# Patient Record
Sex: Female | Born: 1950 | Race: White | Hispanic: No | Smoking: Never smoker
Health system: Southern US, Community
[De-identification: ages and names within clinical notes are randomized; demographics above are authoritative.]

## PROBLEM LIST (undated history)

## (undated) DIAGNOSIS — E039 Hypothyroidism, unspecified: Secondary | ICD-10-CM

## (undated) DIAGNOSIS — F419 Anxiety disorder, unspecified: Secondary | ICD-10-CM

## (undated) DIAGNOSIS — M1990 Unspecified osteoarthritis, unspecified site: Secondary | ICD-10-CM

## (undated) HISTORY — DX: Hypothyroidism, unspecified: E03.9

## (undated) HISTORY — DX: Unspecified osteoarthritis, unspecified site: M19.90

## (undated) HISTORY — PX: ENDOMETRIAL ABLATION: SHX621

## (undated) HISTORY — DX: Anxiety disorder, unspecified: F41.9

---
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
## 2004-06-22 ENCOUNTER — Ambulatory Visit: Payer: Self-pay | Admitting: Internal Medicine

## 2012-12-03 ENCOUNTER — Ambulatory Visit: Payer: Self-pay | Admitting: Medical

## 2013-05-25 DIAGNOSIS — C44320 Squamous cell carcinoma of skin of unspecified parts of face: Secondary | ICD-10-CM | POA: Insufficient documentation

## 2014-11-09 ENCOUNTER — Encounter: Payer: Self-pay | Admitting: Gastroenterology

## 2014-12-28 ENCOUNTER — Ambulatory Visit (AMBULATORY_SURGERY_CENTER): Payer: Self-pay

## 2014-12-28 VITALS — Ht 64.0 in | Wt 105.4 lb

## 2014-12-28 DIAGNOSIS — Z1211 Encounter for screening for malignant neoplasm of colon: Secondary | ICD-10-CM

## 2014-12-28 NOTE — Progress Notes (Signed)
No allergies to eggs or soy No diet/weight loss meds No past problems with anesthesia except PONV with general anesthesia No home oxygen  Refused emmi

## 2015-01-11 ENCOUNTER — Encounter: Payer: Self-pay | Admitting: Gastroenterology

## 2015-01-11 ENCOUNTER — Ambulatory Visit (AMBULATORY_SURGERY_CENTER): Payer: BLUE CROSS/BLUE SHIELD | Admitting: Gastroenterology

## 2015-01-11 VITALS — BP 129/94 | HR 71 | Temp 98.2°F | Resp 22 | Ht 64.0 in | Wt 105.0 lb

## 2015-01-11 DIAGNOSIS — Z1211 Encounter for screening for malignant neoplasm of colon: Secondary | ICD-10-CM

## 2015-01-11 MED ORDER — SODIUM CHLORIDE 0.9 % IV SOLN: 500.0000 mL | INTRAVENOUS | Status: DC

## 2015-01-11 NOTE — Op Note (Signed)
[place]  [address]. [place], [address]   COLONOSCOPY PROCEDURE REPORT  PATIENT: Samantha, Santana  MR#: [id number] BIRTHDATE: 12-14-1950 , 23  yrs. old GENDER: female ENDOSCOPIST: Ladene Artist, MD, [facility] REFERRED TX:MIWOE Noah Delaine, M.D. PROCEDURE DATE:  01/11/2015 PROCEDURE:   Colonoscopy, screening First Screening Colonoscopy - Avg.  risk and is 50 yrs.  old or older - No.  Prior Negative Screening - Now for repeat screening. 10 or more years since last screening  History of Adenoma - Now for follow-up colonoscopy & has been > or = to 3 yrs.  N/A  Polyps removed today? No Recommend repeat exam, <10 yrs? No ASA CLASS:   Class II INDICATIONS:Screening for colonic neoplasia and Colorectal Neoplasm Risk Assessment for this procedure is average risk. MEDICATIONS: Monitored anesthesia care and Propofol 200 mg IV DESCRIPTION OF PROCEDURE:   After the risks benefits and alternatives of the procedure were thoroughly explained, informed consent was obtained.  The digital rectal exam revealed no abnormalities of the rectum.   The LB PFC-H190 [id number]  endoscope was introduced through the anus and advanced to the cecum, which was identified by both the appendix and ileocecal valve. No adverse events experienced with a tortuous colon.   The quality of the prep was good.  (Suprep was used)  The instrument was then slowly withdrawn as the colon was fully examined. Estimated blood loss is zero unless otherwise noted in this procedure report.    COLON FINDINGS: A normal appearing cecum, ileocecal valve, and appendiceal orifice were identified.  The ascending, transverse, descending, sigmoid colon, and rectum appeared unremarkable. Retroflexed views revealed no abnormalities. The time to cecum = 4.9 Withdrawal time = 10.2   The scope was withdrawn and the procedure completed. COMPLICATIONS: There were no immediate complications.  ENDOSCOPIC IMPRESSION: Normal  colonoscopy  RECOMMENDATIONS: Continue current colorectal screening recommendations for "routine risk" patients with a repeat colonoscopy in 10 years.  eSigned:  Ladene Artist, MD, [facility] 01/11/2015 3:12 PM

## 2015-01-11 NOTE — Progress Notes (Signed)
Report to PACU, RN, vss, BBS= Clear.  

## 2015-01-11 NOTE — Patient Instructions (Signed)

## 2015-01-12 ENCOUNTER — Telehealth: Payer: Self-pay

## 2015-01-12 NOTE — Telephone Encounter (Signed)
  Follow up Call-  Call back number 01/11/2015  Post procedure Call Back phone  # [phone]  Permission to leave phone message Yes     Patient questions:  Do you have a fever, pain , or abdominal swelling? No. Pain Score  0 *  Have you tolerated food without any problems? Yes.    Have you been able to return to your normal activities? Yes.    Do you have any questions about your discharge instructions: Diet   No. Medications  No. Follow up visit  No.  Do you have questions or concerns about your Care? No.  Actions: * If pain score is 4 or above: No action needed, pain <4.  No problems per the pt. maw

## 2015-03-02 ENCOUNTER — Ambulatory Visit
Admission: RE | Admit: 2015-03-02 | Discharge: 2015-03-02 | Disposition: A | Discharge status: Home / Self Care | Payer: BLUE CROSS/BLUE SHIELD | Source: Ambulatory Visit | Attending: Medical | Admitting: Medical

## 2015-03-02 ENCOUNTER — Other Ambulatory Visit: Payer: Self-pay | Admitting: Medical

## 2015-03-02 DIAGNOSIS — R053 Chronic cough: Secondary | ICD-10-CM

## 2015-03-02 DIAGNOSIS — R05 Cough: Secondary | ICD-10-CM | POA: Diagnosis not present

## 2015-03-22 ENCOUNTER — Other Ambulatory Visit (INDEPENDENT_AMBULATORY_CARE_PROVIDER_SITE_OTHER): Payer: BLUE CROSS/BLUE SHIELD

## 2015-03-22 ENCOUNTER — Ambulatory Visit (INDEPENDENT_AMBULATORY_CARE_PROVIDER_SITE_OTHER): Payer: BLUE CROSS/BLUE SHIELD | Admitting: Internal Medicine

## 2015-03-22 ENCOUNTER — Encounter: Payer: Self-pay | Admitting: Internal Medicine

## 2015-03-22 VITALS — BP 172/90 | HR 90 | Ht 64.0 in | Wt 104.6 lb

## 2015-03-22 DIAGNOSIS — J45991 Cough variant asthma: Secondary | ICD-10-CM | POA: Diagnosis not present

## 2015-03-22 DIAGNOSIS — I10 Essential (primary) hypertension: Secondary | ICD-10-CM

## 2015-03-22 LAB — CBC WITH DIFFERENTIAL/PLATELET
Basophils Absolute: 0 10*3/uL (ref 0.0–0.1)
Basophils Relative: 0.5 % (ref 0.0–3.0)
EOS PCT: 2.5 % (ref 0.0–5.0)
Eosinophils Absolute: 0.2 10*3/uL (ref 0.0–0.7)
HCT: 42.4 % (ref 36.0–46.0)
Hemoglobin: 13.9 g/dL (ref 12.0–15.0)
LYMPHS ABS: 2.6 10*3/uL (ref 0.7–4.0)
Lymphocytes Relative: 31.6 % (ref 12.0–46.0)
MCHC: 32.7 g/dL (ref 30.0–36.0)
MCV: 84 fl (ref 78.0–100.0)
MONOS PCT: 6.5 % (ref 3.0–12.0)
Monocytes Absolute: 0.5 10*3/uL (ref 0.1–1.0)
NEUTROS ABS: 4.8 10*3/uL (ref 1.4–7.7)
NEUTROS PCT: 58.9 % (ref 43.0–77.0)
PLATELETS: 417 10*3/uL — AB (ref 150.0–400.0)
RBC: 5.05 Mil/uL (ref 3.87–5.11)
RDW: 14.7 % (ref 11.5–15.5)
WBC: 8.1 10*3/uL (ref 4.0–10.5)

## 2015-03-22 LAB — NITRIC OXIDE: Nitric Oxide: 11

## 2015-03-22 MED ORDER — MONTELUKAST SODIUM 10 MG PO TABS: ORAL_TABLET | ORAL | Status: DC

## 2015-03-22 NOTE — Progress Notes (Signed)
Subjective:    Patient ID: Samantha Santana, female    DOB: 17-Mar-1951,     MRN: [id number]  HPI  88 yowf never smoker with onset of itching sneezing running nose x 2010 better p zyrtec then trip to [place] 2012 with "double  pna" on return but "never right since" with worse rhinitis symptoms  More year round pattern with throat tickling and wheeze with rx for advair x 2014-15 but only uses prn not sure it works so referred 03/22/2015 by Liz Malady to pulmonary clinic with ? Cough variant asthma ?   03/22/2015 1st [facility] office visit/ Alissia Lory   Chief Complaint  Patient presents with  . PULMONARY CONSULT    Pt referred by Dr. Estevan Oaks for mild asthma: pt states shes never been diagnosed with asthma. pt states shes spent the fall with dry cough, sneezing starting with a tickle in the back left of her throat and eye watering. pt states she had advair shes had previously and that seemed to help. no c/o SOB, wheezing, or chest tightness.  worse symptoms since  the fall 2016 cough is dry, variably severe, random random during day and also waking at noct assoc with sense of pnds  flonase may have helped when takes it / advair may have helped initially but did have really bad coughing while on it so stopped it completely   No obvious other patterns in day to day or daytime variabilty or assoc sob  or cp or chest tightness, subjective wheeze overt sinus or hb symptoms. No unusual exp hx or h/o childhood pna/ asthma or knowledge of premature birth.  Sleeping ok without nocturnal  or early am exacerbation  of respiratory  c/o's or need for noct saba. Also denies any obvious fluctuation of symptoms with weather or environmental changes or other aggravating or alleviating factors except as outlined above   Current Medications, Allergies, Complete Past Medical History, Past Surgical History, Family History, and Social History were reviewed in [employer] record.         Review of Systems  Constitutional: Negative for fever and unexpected weight change.  HENT: Positive for postnasal drip and sneezing. Negative for congestion, dental problem, ear pain, nosebleeds, rhinorrhea, sinus pressure, sore throat and trouble swallowing.   Eyes: Negative for redness and itching.  Respiratory: Positive for cough. Negative for chest tightness, shortness of breath and wheezing.   Cardiovascular: Negative for palpitations and leg swelling.  Gastrointestinal: Negative for nausea and vomiting.  Genitourinary: Negative for dysuria.  Musculoskeletal: Positive for joint swelling.  Skin: Negative for rash.  Neurological: Positive for headaches.  Hematological: Does not bruise/bleed easily.  Psychiatric/Behavioral: Negative for dysphoric mood. The patient is not nervous/anxious.        Objective:   Physical Exam  Very animated pleasant amb wf nad  Wt Readings from Last 3 Encounters:  03/22/15 104 lb 9.6 oz (47.446 kg)  01/11/15 105 lb (47.628 kg)  12/28/14 105 lb 6.4 oz (47.809 kg)    Vital signs reviewed    HEENT: nl dentition, turbinates, and oropharynx. Nl external ear canals without cough reflex   NECK :  without JVD/Nodes/TM/ nl carotid upstrokes bilaterally   LUNGS: no acc muscle use,  Nl contour chest which is clear to A and P bilaterally without cough on insp or exp maneuvers   CV:  RRR  no s3 or murmur or increase in P2, no edema   ABD:  soft and nontender with nl inspiratory  excursion in the supine position. No bruits or organomegaly, bowel sounds nl  MS:  Nl gait/ ext warm without deformities, calf tenderness, cyanosis or clubbing No obvious joint restrictions   SKIN: warm and dry without lesions    NEURO:  alert, approp, nl sensorium with  no motor deficits     I personally reviewed images and agree with radiology impression as follows:  CXR:  03/02/15 1. There is no evidence of pneumonia, CHF, nor other acute cardiopulmonary  abnormality. 2. Mild hyperinflation may be voluntary or may reflect underlying reactive airway disease. 3. There is evidence of previous granulomatous infection   Labs ordered 03/22/2015  Cbc with diff,  Allergy profile      Assessment & Plan:

## 2015-03-22 NOTE — Patient Instructions (Addendum)
For drainage / throat tickle try take CHLORPHENIRAMINE  4 mg - take one every 4 hours as needed - available over the counter- may cause drowsiness so start with just a bedtime dose or two and see how you tolerate it before trying in daytime    GERD (REFLUX)  is an extremely common cause of respiratory symptoms just like yours , many times with no obvious heartburn at all.    It can be treated with medication, but also with lifestyle changes including elevation of the head of your bed (ideally with 6 inch  bed blocks),  Smoking cessation, avoidance of late meals, excessive alcohol, and avoid fatty foods, chocolate, peppermint, colas, red wine, and acidic juices such as orange juice.  NO MINT OR MENTHOL PRODUCTS SO NO COUGH DROPS  USE SUGARLESS CANDY INSTEAD (Jolley ranchers or Stover's or Life Savers) or even ice chips will also do - the key is to swallow to prevent all throat clearing. NO OIL BASED VITAMINS - use powdered substitutes.    singulair (montelukast) 10 mg each pm   Please remember to go to the lab  department downstairs for your tests - we will call you with the results when they are available.  Please schedule a follow up office visit in 4 weeks, sooner if needed

## 2015-03-22 NOTE — Assessment & Plan Note (Addendum)
NO 03/22/2015  =  11 - Eos 0.2/ IgE pending   The most common causes of chronic cough in immunocompetent adults include the following: upper airway cough syndrome (UACS), previously referred to as postnasal drip syndrome (PNDS), which is caused by variety of rhinosinus conditions; (2) asthma; (3) GERD; (4) chronic bronchitis from cigarette smoking or other inhaled environmental irritants; (5) nonasthmatic eosinophilic bronchitis; and (6) bronchiectasis.   These conditions, singly or in combination, have accounted for up to 94% of the causes of chronic cough in prospective studies.   Other conditions have constituted no >6% of the causes in prospective studies These have included bronchogenic carcinoma, chronic interstitial pneumonia, sarcoidosis, left ventricular failure, ACEI-induced cough, and aspiration from a condition associated with pharyngeal dysfunction.    Chronic cough is often simultaneously caused by more than one condition. A single cause has been found from 38 to 82% of the time, multiple causes from 18 to 62%. Multiply caused cough has been the result of three diseases up to 42% of the time.      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   These are the same pts (now being labeled as having "irritable larynx syndrome" by some cough centers) who not infrequently have a history of having failed to tolerate ace inhibitors,  dry powder inhalers (which may have been the case here)or biphosphonates or report having atypical  reflux symptoms that don't respond to standard doses of PPI , and are easily confused as having aecopd or asthma flares by even experienced allergists/ pulmonologists.   For now rec trial of singulair and off advair plus use 1st gen H1 per guidelines plus gerd diet s acid suppression for time being and do allergy screen now then regroup in 4 weeks for methacholine challenge while on max gerd rx if not improved  Reviewed with Pt The standardized cough guidelines published in Chest by Lissa Morales in 2006 are still the best available and consist of a multiple step process (up to 12!) , not a single office visit,  and are intended  to address this problem logically,  with an alogrithm dependent on response to empiric treatment at  each progressive step  to determine a specific diagnosis with  minimal addtional testing needed. Therefore if adherence is an issue or can't be accurately verified,  it's very unlikely the standard evaluation and treatment will be successful here.    Furthermore, response to therapy (other than acute cough suppression, which should only be used short term with avoidance of narcotic containing cough syrups if possible), can be a gradual process for which the patient may perceive immediate benefit.  Unlike going to an eye doctor where the best perscription is almost always the first one and is immediately effective, this is almost never the case in the management of chronic cough syndromes. Therefore the patient needs to commit up front to consistently adhere to recommendations  for up to 6 weeks of therapy directed at the likely underlying problem(s) before the  response can be reasonably evaluated.   If not better next step is gerd rx and do methacholine challenge p 2 weeks on gerd rx

## 2015-03-23 ENCOUNTER — Encounter: Payer: Self-pay | Admitting: Internal Medicine

## 2015-03-23 DIAGNOSIS — I10 Essential (primary) hypertension: Secondary | ICD-10-CM | POA: Insufficient documentation

## 2015-03-23 LAB — ALLERGY FULL PROFILE
Alternaria Alternata: <0.1 kU/L
Aspergillus fumigatus, m3: <0.1 kU/L
Bahia Grass: <0.1 kU/L
Bermuda Grass: <0.1 kU/L
Cat Dander: <0.1 kU/L
Curvularia lunata: <0.1 kU/L
Elm IgE: <0.1 kU/L
Fescue: <0.1 kU/L
G009 Red Top: <0.1 kU/L
House Dust Hollister: <0.1 kU/L
IGE (IMMUNOGLOBULIN E), SERUM: 49 kU/L (ref <115)
Oak: <0.1 kU/L
Plantain: <0.1 kU/L

## 2015-03-23 NOTE — Assessment & Plan Note (Signed)
Reports she has white coat hypertension and when she is not at the doctor's office her blood pressure runs much lower. I advised her to follow-up for sure with her primary care provider.

## 2015-03-24 ENCOUNTER — Telehealth: Payer: Self-pay | Admitting: Internal Medicine

## 2015-03-24 NOTE — Telephone Encounter (Signed)
Patient notified of lab results. Nothing further needed.  

## 2015-04-21 ENCOUNTER — Encounter: Payer: Self-pay | Admitting: Internal Medicine

## 2015-04-21 ENCOUNTER — Ambulatory Visit (INDEPENDENT_AMBULATORY_CARE_PROVIDER_SITE_OTHER): Payer: BLUE CROSS/BLUE SHIELD | Admitting: Internal Medicine

## 2015-04-21 VITALS — BP 146/80 | HR 88 | Ht 64.0 in | Wt 105.0 lb

## 2015-04-21 DIAGNOSIS — J45991 Cough variant asthma: Secondary | ICD-10-CM

## 2015-04-21 NOTE — Assessment & Plan Note (Signed)
NO 03/22/2015  =  11 - allergy profile 03/22/2015   >  Eos 0.2/ IgE 49 neg RAST  - rx trial of singulair 03/22/2015 > never took it - MCT rec 04/21/2015 >>>   I had an extended final summary discussion with the patient reviewing all relevant studies completed to date and  lasting 15 to 20 minutes of a 25 minute visit on the following issues:   1) turns out her problem is really intermittent, not chronic, but could still be asthmatic 2) definitive test is mct > ordered 3) in meantime ok to rx with h1 but if not better add h2 hs and then ppi if daytime cough flares     Each maintenance medication was reviewed in detail including most importantly the difference between maintenance and prns and under what circumstances the prns are to be triggered using an action plan format that is not reflected in the computer generated alphabetically organized AVS.    Please see instructions for details which were reviewed in writing and the patient given a copy highlighting the part that I personally wrote and discussed at today's ov.

## 2015-04-21 NOTE — Progress Notes (Signed)
Subjective:    Patient ID: Samantha Santana, female    DOB: 05/24/1950,     MRN: [id number]    Brief patient profile:  4 yowf never smoker with onset of itching sneezing running nose x 2010 better p zyrtec then trip to [place] 2012 with "double  pna" on return but "never right since" (actually does get better to where no meds are needed at all x months) with worse rhinitis symptoms random but can be triggered by weather  with throat tickling and wheeze with rx for advair x 2014-15 but only uses prn not sure it works so referred 03/22/2015 by Liz Malady to pulmonary clinic with ? Cough variant asthma ?   W/u: NO 03/22/2015  =  11 - allergy profile 03/22/2015   >  Eos 0.2/ IgE 49 neg RAST  - rx trial of singulair 03/22/2015 >     History of Present Illness  03/22/2015 1st [facility] office visit/ Joaquim Tolen   Chief Complaint  Patient presents with  . PULMONARY CONSULT    Pt referred by Dr. Estevan Oaks for mild asthma: pt states shes never been diagnosed with asthma. pt states shes spent the fall with dry cough, sneezing starting with a tickle in the back left of her throat and eye watering. pt states she had advair shes had previously and that seemed to help. no c/o SOB, wheezing, or chest tightness.  worse symptoms since  the fall 2016 cough is dry, variably severe, random random during day and also waking at noct assoc with sense of pnds  flonase may have helped when takes it / advair may have helped initially but did have really bad coughing while on it so stopped it completely  rec For drainage / throat tickle try take CHLORPHENIRAMINE  4 mg - take one every 4 hours as needed - available over the counter- may cause drowsiness so start with just a bedtime dose or two and see how you tolerate it before trying in daytime   GERD diet   Singulair (montelukast) 10 mg each pm    04/21/2015  f/u ov/Charlene Cowdrey re: uacs mild flare  Chief Complaint  Patient presents with  . Follow-up    Pt  states that she never started on her singulair. She states overall doing well until 3 days ago started to cough some.   cough tends to be more dry and more at hs / better p 1st gen h1  Not limited by breathing from desired activities    No obvious day to day or daytime variability or assoc excess/ purulent sputum or mucus plugs or hemoptysis or cp or chest tightness, subjective wheeze or overt sinus or hb symptoms. No unusual exp hx or h/o childhood pna/ asthma or knowledge of premature birth.  Sleeping ok without nocturnal  or early am exacerbation  of respiratory  c/o's or need for noct saba. Also denies any obvious fluctuation of symptoms with weather or environmental changes or other aggravating or alleviating factors except as outlined above   Current Medications, Allergies, Complete Past Medical History, Past Surgical History, Family History, and Social History were reviewed in [employer] record.  ROS  The following are not active complaints unless bolded sore throat, dysphagia, dental problems, itching, sneezing,  nasal congestion or excess/ purulent secretions, ear ache,   fever, chills, sweats, unintended wt loss, classically pleuritic or exertional cp,  orthopnea pnd or leg swelling, presyncope, palpitations, abdominal pain, anorexia, nausea, vomiting, diarrhea  or  change in bowel or bladder habits, change in stools or urine, dysuria,hematuria,  rash, arthralgias, visual complaints, headache, numbness, weakness or ataxia or problems with walking or coordination,  change in mood/affect or memory.                  Objective:   Physical Exam  Very animated pleasant amb wf nad   04/21/2015        105  03/22/15 104 lb 9.6 oz (47.446 kg)  01/11/15 105 lb (47.628 kg)  12/28/14 105 lb 6.4 oz (47.809 kg)    Vital signs reviewed    HEENT: nl dentition, turbinates, and oropharynx. Nl external ear canals without cough reflex   NECK :  without JVD/Nodes/TM/  nl carotid upstrokes bilaterally   LUNGS: no acc muscle use,  Nl contour chest / a few pops and squeaks on insp bilaterally / mostly upper airway, no exp wheeze or exp cough    CV:  RRR  no s3 or murmur or increase in P2, no edema   ABD:  soft and nontender with nl inspiratory excursion in the supine position. No bruits or organomegaly, bowel sounds nl  MS:  Nl gait/ ext warm without deformities, calf tenderness, cyanosis or clubbing No obvious joint restrictions   SKIN: warm and dry without lesions    NEURO:  alert, approp, nl sensorium with  no motor deficits     I personally reviewed images and agree with radiology impression as follows:  CXR:  03/02/15 1. There is no evidence of pneumonia, CHF, nor other acute cardiopulmonary abnormality. 2. Mild hyperinflation may be voluntary or may reflect underlying reactive airway disease. 3. There is evidence of previous granulomatous infection        Assessment & Plan:

## 2015-04-21 NOTE — Patient Instructions (Addendum)
Please see patient coordinator before you leave today  to schedule methacholine choline test and I will call you with the results   Any time your cough flares > try pepcid 20 mg at bedtime and if still coughing add prilosec otc 20 mg Take 30-60 min before first meal of the day   I will try to get the nitric oxide test removed from your bill since your insurance doesn't pay for it  Pulmonary follow up will be as needed

## 2015-04-28 ENCOUNTER — Encounter (HOSPITAL_COMMUNITY): Payer: BLUE CROSS/BLUE SHIELD

## 2015-04-28 ENCOUNTER — Ambulatory Visit (HOSPITAL_COMMUNITY)
Admission: RE | Admit: 2015-04-28 | Discharge: 2015-04-28 | Disposition: A | Discharge status: Home / Self Care | Payer: BLUE CROSS/BLUE SHIELD | Source: Ambulatory Visit | Attending: Internal Medicine | Admitting: Internal Medicine

## 2015-04-28 DIAGNOSIS — J45991 Cough variant asthma: Secondary | ICD-10-CM

## 2015-04-28 LAB — PULMONARY FUNCTION TEST
FEF 25-75 POST: 1.75 L/s
FEF 25-75 Pre: 1.75 L/sec
FEF2575-%Change-Post: 0 %
FEF2575-%PRED-POST: 80 %
FEF2575-%PRED-PRE: 80 %
FEV1-%Change-Post: 0 %
FEV1-%PRED-PRE: 79 %
FEV1-%Pred-Post: 79 %
FEV1-Post: 1.89 L
FEV1-Pre: 1.9 L
FEV1FVC-%Change-Post: 6 %
FEV1FVC-%PRED-PRE: 99 %
FEV6-%Change-Post: -3 %
FEV6-%Pred-Post: 76 %
FEV6-%Pred-Pre: 79 %
FEV6-POST: 2.28 L
FEV6-Pre: 2.37 L
FEV6FVC-%CHANGE-POST: 3 %
FEV6FVC-%PRED-POST: 104 %
FEV6FVC-%Pred-Pre: 100 %
FVC-%Change-Post: -6 %
FVC-%PRED-PRE: 78 %
FVC-%Pred-Post: 73 %
FVC-POST: 2.28 L
FVC-PRE: 2.44 L
POST FEV1/FVC RATIO: 83 %
PRE FEV1/FVC RATIO: 78 %
Post FEV6/FVC ratio: 100 %
Pre FEV6/FVC Ratio: 97 %

## 2015-04-28 MED ORDER — METHACHOLINE 4 MG/ML NEB SOLN
2.0000 mL | Freq: Once | RESPIRATORY_TRACT | Status: AC
  Administered 2015-04-28: 8 mg via RESPIRATORY_TRACT

## 2015-04-28 MED ORDER — METHACHOLINE 16 MG/ML NEB SOLN
2.0000 mL | Freq: Once | RESPIRATORY_TRACT | Status: AC
  Administered 2015-04-28: 32 mg via RESPIRATORY_TRACT

## 2015-04-28 MED ORDER — ALBUTEROL SULFATE (2.5 MG/3ML) 0.083% IN NEBU
2.5000 mg | INHALATION_SOLUTION | Freq: Once | RESPIRATORY_TRACT | Status: AC
  Administered 2015-04-28: 2.5 mg via RESPIRATORY_TRACT

## 2015-04-28 MED ORDER — SODIUM CHLORIDE 0.9 % IN NEBU
3.0000 mL | INHALATION_SOLUTION | Freq: Once | RESPIRATORY_TRACT | Status: AC
  Administered 2015-04-28: 3 mL via RESPIRATORY_TRACT

## 2015-04-28 MED ORDER — METHACHOLINE 0.25 MG/ML NEB SOLN
2.0000 mL | Freq: Once | RESPIRATORY_TRACT | Status: AC
  Administered 2015-04-28: 0.5 mg via RESPIRATORY_TRACT

## 2015-04-28 MED ORDER — METHACHOLINE 1 MG/ML NEB SOLN
2.0000 mL | Freq: Once | RESPIRATORY_TRACT | Status: AC
  Administered 2015-04-28: 2 mg via RESPIRATORY_TRACT

## 2015-04-28 MED ORDER — METHACHOLINE 0.0625 MG/ML NEB SOLN
2.0000 mL | Freq: Once | RESPIRATORY_TRACT | Status: AC
  Administered 2015-04-28: 0.125 mg via RESPIRATORY_TRACT

## 2015-04-29 ENCOUNTER — Telehealth: Payer: Self-pay | Admitting: Internal Medicine

## 2015-04-29 NOTE — Progress Notes (Signed)
Quick Note:  LMTCB ______ 

## 2015-04-29 NOTE — Telephone Encounter (Signed)
Result Note     Call patient : Study is c/w asthma but only at the highest level of challenge which may or may not related to symptoms so needs ov next avail to go over specifics and consider change in longterm treatment options  ---  I spoke with patient about results and she verbalized understanding and had no questions appt scheduled for 2/14

## 2015-05-17 ENCOUNTER — Encounter: Payer: Self-pay | Admitting: Internal Medicine

## 2015-05-17 ENCOUNTER — Ambulatory Visit (INDEPENDENT_AMBULATORY_CARE_PROVIDER_SITE_OTHER): Payer: BLUE CROSS/BLUE SHIELD | Admitting: Internal Medicine

## 2015-05-17 VITALS — BP 160/90 | HR 88 | Ht 64.0 in | Wt 105.0 lb

## 2015-05-17 DIAGNOSIS — J45991 Cough variant asthma: Secondary | ICD-10-CM | POA: Diagnosis not present

## 2015-05-17 MED ORDER — MOMETASONE FURO-FORMOTEROL FUM 100-5 MCG/ACT IN AERO: INHALATION_SPRAY | RESPIRATORY_TRACT | Status: AC

## 2015-05-17 NOTE — Progress Notes (Signed)
Subjective:    Patient ID: Samantha Santana, female    DOB: [birth year],     MRN: [id number]    Brief patient profile:  72 yowf never smoker with onset of itching sneezing running nose x 2010 better p zyrtec then trip to [place] 2012 with "double  pna" on return but "never right since" (actually does get better to where no meds are needed at all x months) with worse rhinitis symptoms random but can be triggered by weather  with throat tickling and wheeze with rx for advair x 2014-15 but only uses prn not sure it works so referred 03/22/2015 by Liz Malady to pulmonary clinic with ? Cough variant asthma ?   W/u: NO 03/22/2015  =  11 - allergy profile 03/22/2015   >  Eos 0.2/ IgE 49 neg RAST  - rx trial of singulair 03/22/2015 >     History of Present Illness  03/22/2015 1st [facility] office visit/ Samantha Santana   Chief Complaint  Patient presents with  . PULMONARY CONSULT    Pt referred by Dr. Estevan Oaks for mild asthma: pt states shes never been diagnosed with asthma. pt states shes spent the fall with dry cough, sneezing starting with a tickle in the back left of her throat and eye watering. pt states she had advair shes had previously and that seemed to help. no c/o SOB, wheezing, or chest tightness.  worse symptoms since  the fall 2016 cough is dry, variably severe, random random during day and also waking at noct assoc with sense of pnds  flonase may have helped when takes it / advair may have helped initially but did have really bad coughing while on it so stopped it completely  rec For drainage / throat tickle try take CHLORPHENIRAMINE  4 mg - take one every 4 hours as needed - available over the counter- may cause drowsiness so start with just a bedtime dose or two and see how you tolerate it before trying in daytime   GERD diet   Singulair (montelukast) 10 mg each pm    04/21/2015  f/u ov/Samantha Santana re: uacs mild flare  Chief Complaint  Patient presents with  . Follow-up    Pt  states that she never started on her singulair. She states overall doing well until 3 days ago started to cough some.   cough tends to be more dry and more at hs / better p 1st gen h1 Not limited by breathing from desired activities   rec Please see patient coordinator before you leave today  to schedule methacholine choline test and I will call you with the results  Any time your cough flares > try pepcid 20 mg at bedtime and if still coughing add prilosec otc 20 mg Take 30-60 min before first meal of the day      MCT 04/28/15  Chest tightness progressively worse with the high dose > better  5 minutes p saba   05/17/2015  f/u ov/Samantha Santana re: ? Cough variant asthma (though note no cough during Pos MCT)/ no resp meds  Chief Complaint  Patient presents with  . Asthma    review pft results and treatment options.   cough now gone / Not limited by breathing from desired activities      No obvious day to day or daytime variability or assoc excess/ purulent sputum or mucus plugs or hemoptysis or cp or chest tightness, subjective wheeze or overt sinus or hb symptoms. No unusual exp hx  or h/o childhood pna/ asthma or knowledge of premature birth.  Sleeping ok without nocturnal  or early am exacerbation  of respiratory  c/o's or need for noct saba. Also denies any obvious fluctuation of symptoms with weather or environmental changes or other aggravating or alleviating factors except as outlined above   Current Medications, Allergies, Complete Past Medical History, Past Surgical History, Family History, and Social History were reviewed in [employer] record.  ROS  The following are not active complaints unless bolded sore throat, dysphagia, dental problems, itching, sneezing,  nasal congestion or excess/ purulent secretions, ear ache,   fever, chills, sweats, unintended wt loss, classically pleuritic or exertional cp,  orthopnea pnd or leg swelling, presyncope, palpitations,  abdominal pain, anorexia, nausea, vomiting, diarrhea  or change in bowel or bladder habits, change in stools or urine, dysuria,hematuria,  rash, arthralgias, visual complaints, headache, numbness, weakness or ataxia or problems with walking or coordination,  change in mood/affect or memory.                  Objective:   Physical Exam  Very animated pleasant amb wf nad   04/21/2015        105  > 05/17/2015   105   03/22/15 104 lb 9.6 oz (47.446 kg)  01/11/15 105 lb (47.628 kg)  12/28/14 105 lb 6.4 oz (47.809 kg)    Vital signs reviewed    HEENT: nl dentition, turbinates, and oropharynx. Nl external ear canals without cough reflex   NECK :  without JVD/Nodes/TM/ nl carotid upstrokes bilaterally   LUNGS: no acc muscle use,  Nl contour chest / min pops and squeaks on insp bilaterally / mostly upper airway, no exp wheeze or exp cough    CV:  RRR  no s3 or murmur or increase in P2, no edema   ABD:  soft and nontender with nl inspiratory excursion in the supine position. No bruits or organomegaly, bowel sounds nl  MS:  Nl gait/ ext warm without deformities, calf tenderness, cyanosis or clubbing No obvious joint restrictions   SKIN: warm and dry without lesions    NEURO:  alert, approp, nl sensorium with  no motor deficits     I personally reviewed images and agree with radiology impression as follows:  CXR:  03/02/15 1. There is no evidence of pneumonia, CHF, nor other acute cardiopulmonary abnormality. 2. Mild hyperinflation may be voluntary or may reflect underlying reactive airway disease. 3. There is evidence of previous granulomatous infection        Assessment & Plan:

## 2015-05-17 NOTE — Patient Instructions (Signed)
In the event of any cough/ shortness of breath/ chest tightness (like the type you had at the test) ok to try the dulera 100 up to 2 pffs every 12 hours  Work on inhaler technique:  relax and gently blow all the way out then take a nice smooth deep breath back in, triggering the inhaler at same time you start breathing in.  Hold for up to 5 seconds if you can. Blow out thru nose. Rinse and gargle with water when done     If you are satisfied with your treatment plan,  let your doctor know and he/she can either refill your medications or you can return here when your prescription runs out.     If in any way you are not 100% satisfied,  please tell us.  If 100% better, tell your friends!  Pulmonary follow up is as needed

## 2015-05-17 NOTE — Assessment & Plan Note (Signed)
NO 03/22/2015  =  11 - allergy profile 03/22/2015   >  Eos 0.2/ IgE 49 neg RAST  - rx trial of singulair 03/22/2015 > never took it - MCT 04/28/15 > pos @ 16  - 05/17/2015  extensive coaching HFA effectiveness =   75%   > trial of dulera 100   I had an extended final summary discussion with the patient reviewing all relevant studies completed to date and  lasting 15 to 20 minutes of a 25 minute visit on the following issues:    1) clearly has asthma but very mild symptoms to point she doesn't even recognize them and doesn't want to take maint rx of any kind  2) symb 80 or dulera 100 best options here to use up to 2 pffs q12 h prn   3) Each maintenance medication was reviewed in detail including most importantly the difference between maintenance and as needed and under what circumstances the prns are to be used.  Please see instructions for details which were reviewed in writing and the patient given a copy.

## 2015-05-23 ENCOUNTER — Ambulatory Visit (INDEPENDENT_AMBULATORY_CARE_PROVIDER_SITE_OTHER): Payer: BLUE CROSS/BLUE SHIELD

## 2015-05-23 ENCOUNTER — Encounter: Payer: Self-pay | Admitting: Podiatry

## 2015-05-23 ENCOUNTER — Ambulatory Visit (INDEPENDENT_AMBULATORY_CARE_PROVIDER_SITE_OTHER): Payer: BLUE CROSS/BLUE SHIELD | Admitting: Podiatry

## 2015-05-23 VITALS — BP 146/90 | HR 90 | Resp 16

## 2015-05-23 DIAGNOSIS — S90122A Contusion of left lesser toe(s) without damage to nail, initial encounter: Secondary | ICD-10-CM | POA: Diagnosis not present

## 2015-05-23 DIAGNOSIS — L603 Nail dystrophy: Secondary | ICD-10-CM

## 2015-05-23 NOTE — Progress Notes (Signed)
   Subjective:    Patient ID: Samantha Santana, female    DOB: 1951-01-11, 65 y.o.   MRN: [id number]  HPI: She presents today for a concern about an abnormal shape to the hallux nail left. She also states that this is been going on for quite some time when she ended up splitting the nail and a portion of the nail fell off leaving a small spicule of nail on the tibial side that continues to grow irregularly. She is concerned about the nodularity or the shape of the distal portion of the new toenail at this point. She is also concerned of a bruise that she noticed that overlying the DIPJ of the second digit left foot after hitting it in the bathroom. This occurred yesterday.    Review of Systems  Musculoskeletal: Positive for arthralgias.  All other systems reviewed and are negative.      Objective:   Physical Exam: 65 year old female vital signs stable alert and oriented 3 in no apparent distress. Pulses are strongly palpable bilateral. Neurologic sensorium is intact per Semmes-Weinstein monofilament. Deep tendon reflexes are intact. Muscle strength is 5 over 5 dorsiflexion plantar flexors and inverters everters all just musculature is intact. 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 Left greater than right. She has a contusion to the DIPJ with no pain on palpation second digit left foot. Cutaneous evaluation of his wrist no open lesions or wounds. Radiographs confirmed no fracture. She does have an abnormal shape nail plate as the nail is growing out it appears to be rolling under but this is normal I encouraged her to keep this moisturized.        Assessment & Plan:  Assessment: Intrusion second digit left foot without fracture. Normal nail plate hallux left. Severe hallux rigidus bilateral.  Plan: Discussed etiology pathology  conservative versus surgical therapies. 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 I did suggest keeping the hallux left moisturized so that the nail may grow through the skin normally. Follow up with her as needed.

## 2015-05-30 ENCOUNTER — Ambulatory Visit
Admission: RE | Admit: 2015-05-30 | Discharge: 2015-05-30 | Disposition: A | Discharge status: Home / Self Care | Payer: BLUE CROSS/BLUE SHIELD | Source: Ambulatory Visit | Attending: Family Medicine | Admitting: Family Medicine

## 2015-05-30 ENCOUNTER — Other Ambulatory Visit: Payer: Self-pay | Admitting: Family Medicine

## 2015-05-30 DIAGNOSIS — M25511 Pain in right shoulder: Secondary | ICD-10-CM | POA: Insufficient documentation

## 2015-05-30 DIAGNOSIS — M25411 Effusion, right shoulder: Secondary | ICD-10-CM | POA: Insufficient documentation

## 2015-05-30 DIAGNOSIS — R609 Edema, unspecified: Secondary | ICD-10-CM

## 2016-07-06 ENCOUNTER — Other Ambulatory Visit: Payer: BLUE CROSS/BLUE SHIELD

## 2016-07-09 ENCOUNTER — Other Ambulatory Visit: Payer: BLUE CROSS/BLUE SHIELD

## 2016-07-09 DIAGNOSIS — E039 Hypothyroidism, unspecified: Secondary | ICD-10-CM

## 2016-07-09 DIAGNOSIS — E875 Hyperkalemia: Secondary | ICD-10-CM

## 2016-07-09 DIAGNOSIS — M810 Age-related osteoporosis without current pathological fracture: Secondary | ICD-10-CM

## 2016-07-10 LAB — CMP12+LP+TP+TSH+6AC+CBC/D/PLT
A/G RATIO: 2 (ref 1.2–2.2)
ALBUMIN: 4.5 g/dL (ref 3.6–4.8)
ALT: 15 IU/L (ref 0–32)
AST: 28 IU/L (ref 0–40)
Alkaline Phosphatase: 68 IU/L (ref 39–117)
BUN/Creatinine Ratio: 15 (ref 12–28)
BUN: 10 mg/dL (ref 8–27)
Basophils Absolute: 0 10*3/uL (ref 0.0–0.2)
Basos: 1 %
Bilirubin Total: 0.3 mg/dL (ref 0.0–1.2)
CHLORIDE: 95 mmol/L — AB (ref 96–106)
CHOL/HDL RATIO: 1.8 ratio (ref 0.0–4.4)
CHOLESTEROL TOTAL: 139 mg/dL (ref 100–199)
Calcium: 9.4 mg/dL (ref 8.7–10.3)
Creatinine, Ser: 0.68 mg/dL (ref 0.57–1.00)
EOS (ABSOLUTE): 0.4 10*3/uL (ref 0.0–0.4)
Eos: 6 %
Estimated CHD Risk: <0.5 times avg. (ref 0.0–1.0)
Free Thyroxine Index: 2.5 (ref 1.2–4.9)
GFR calc Af Amer: 108 mL/min/{1.73_m2} (ref >59)
GFR, EST NON AFRICAN AMERICAN: 93 mL/min/{1.73_m2} (ref >59)
GGT: 14 IU/L (ref 0–60)
GLOBULIN, TOTAL: 2.3 g/dL (ref 1.5–4.5)
GLUCOSE: 99 mg/dL (ref 65–99)
HDL: 77 mg/dL (ref >39)
Hematocrit: 44.1 % (ref 34.0–46.6)
Hemoglobin: 14.4 g/dL (ref 11.1–15.9)
IMMATURE GRANS (ABS): 0 10*3/uL (ref 0.0–0.1)
IRON: 74 ug/dL (ref 27–139)
Immature Granulocytes: 0 %
LDH: 209 IU/L (ref 119–226)
LDL Calculated: 47 mg/dL (ref 0–99)
LYMPHS: 41 %
Lymphocytes Absolute: 2.6 10*3/uL (ref 0.7–3.1)
MCH: 27.5 pg (ref 26.6–33.0)
MCHC: 32.7 g/dL (ref 31.5–35.7)
MCV: 84 fL (ref 79–97)
Monocytes Absolute: 0.5 10*3/uL (ref 0.1–0.9)
Monocytes: 8 %
NEUTROS ABS: 2.8 10*3/uL (ref 1.4–7.0)
Neutrophils: 44 %
PHOSPHORUS: 4.2 mg/dL (ref 2.5–4.5)
PLATELETS: 384 10*3/uL — AB (ref 150–379)
POTASSIUM: 4.5 mmol/L (ref 3.5–5.2)
RBC: 5.24 x10E6/uL (ref 3.77–5.28)
RDW: 15.2 % (ref 12.3–15.4)
Sodium: 134 mmol/L (ref 134–144)
T3 UPTAKE RATIO: 30 % (ref 24–39)
T4, Total: 8.2 ug/dL (ref 4.5–12.0)
TRIGLYCERIDES: 76 mg/dL (ref 0–149)
TSH: 1.26 u[IU]/mL (ref 0.450–4.500)
Total Protein: 6.8 g/dL (ref 6.0–8.5)
Uric Acid: 2.7 mg/dL (ref 2.5–7.1)
VLDL Cholesterol Cal: 15 mg/dL (ref 5–40)
WBC: 6.3 10*3/uL (ref 3.4–10.8)

## 2016-07-10 LAB — VITAMIN D 25 HYDROXY (VIT D DEFICIENCY, FRACTURES): VIT D 25 HYDROXY: 68.4 ng/mL (ref 30.0–100.0)

## 2016-10-10 ENCOUNTER — Ambulatory Visit: Payer: BLUE CROSS/BLUE SHIELD | Admitting: Medical

## 2016-10-10 ENCOUNTER — Encounter: Payer: Self-pay | Admitting: Medical

## 2016-10-10 VITALS — BP 138/88 | HR 77 | Temp 98.1°F | Resp 16 | Ht 62.0 in | Wt 103.0 lb

## 2016-10-10 DIAGNOSIS — R062 Wheezing: Secondary | ICD-10-CM

## 2016-10-10 MED ORDER — FLUTICASONE-SALMETEROL 100-50 MCG/DOSE IN AEPB: 1.0000 | INHALATION_SPRAY | Freq: Two times a day (BID) | RESPIRATORY_TRACT | 3 refills | Status: AC

## 2016-10-10 NOTE — Progress Notes (Signed)
   Subjective:    Patient ID: Samantha Santana, female    DOB: 1950-08-11, 66 y.o.   MRN: [id number]  HPI  66 yo who saw her OB/GYN on June 29th who noticed patient  was wheezing. Does not feel like she has not been wheezing today. Has Proair at home.  Feels well.   Review of Systems  Constitutional: Negative for chills and fever.  HENT: Negative for congestion, ear discharge and sore throat.   Eyes: Negative for discharge and itching.  Respiratory: Positive for wheezing. Negative for cough and shortness of breath.   Cardiovascular: Negative for chest pain.  Gastrointestinal: Negative for abdominal pain.  Genitourinary: Negative for hematuria.  Musculoskeletal: Negative for myalgias.  Skin: Negative for rash.  Neurological: Negative for dizziness and syncope.  Psychiatric/Behavioral: Negative for behavioral problems, confusion and hallucinations.       Objective:   Physical Exam  Constitutional: She is oriented to person, place, and time. She appears well-developed and well-nourished.  HENT:  Head: Normocephalic and atraumatic.  Right Ear: External ear normal.  Left Ear: External ear normal.  Eyes: Pupils are equal, round, and reactive to light. Conjunctivae and EOM are normal.  Cardiovascular: Normal rate, regular rhythm and normal heart sounds.   Pulmonary/Chest: Effort normal. She has wheezes.  Neurological: She is alert and oriented to person, place, and time.  Skin: Skin is warm and dry.  Psychiatric: She has a normal mood and affect. Her behavior is normal. Judgment and thought content normal.  Nursing note and vitals reviewed.      Wheezing all 4 quadrants. No respiratory distress, patient able to talk in complete sentences. No accessory muscle use.    Assessment & Plan:  Wheezing , to use Proair 2 puff every 6 hours as needed for chest tightness or wheezing. (patient has one at home). E-prescribed Advair 100/50 mg one puff twice daily #1  #3 Follow up in one week  for recheck.

## 2016-10-10 NOTE — Patient Instructions (Signed)
Apoointment follow up in one week  Bronchospasm, Adult Bronchospasm is a tightening of the airways going into the lungs. During an episode, it may be harder to breathe. You may cough, and you may make a whistling sound when you breathe (wheeze). This condition often affects people with asthma. What are the causes? This condition is caused by swelling and irritation in the airways. It can be triggered by:  An infection (common).  Seasonal allergies.  An allergic reaction.  Exercise.  Irritants. These include pollution, cigarette smoke, strong odors, aerosol sprays, and paint fumes.  Weather changes. Winds increase molds and pollens in the air. Cold air may cause swelling.  Stress and emotional upset.  What are the signs or symptoms? Symptoms of this condition include:  Wheezing. If the episode was triggered by an allergy, wheezing may start right away or hours later.  Nighttime coughing.  Frequent or severe coughing with a simple cold.  Chest tightness.  Shortness of breath.  Decreased ability to exercise.  How is this diagnosed? This condition is usually diagnosed with a review of your medical history and a physical exam. Tests, such as lung function tests, are sometimes done to look for other conditions. The need for a chest X-ray depends on where the wheezing occurs and whether it is the first time you have wheezed. How is this treated? This condition may be treated with:  Inhaled medicines. These open up the airways and help you breathe. They can be taken with an inhaler or a nebulizer device.  Corticosteroid medicines. These may be given for severe bronchospasm, usually when it is associated with asthma.  Avoiding triggers, such as irritants, infection, or allergies.  Follow these instructions at home: Medicines  Take over-the-counter and prescription medicines only as told by your health care provider.  If you need to use an inhaler or nebulizer to take your  medicine, ask your health care provider to explain how to use it correctly. If you were given a spacer, always use it with your inhaler. Lifestyle  Reduce the number of triggers in your home. To do this: ? Change your heating and air conditioning filter at least once a month. ? Limit your use of fireplaces and wood stoves. ? Do not smoke. Do not allow smoking in your home. ? Avoid using perfumes and fragrances. ? Get rid of pests, such as roaches and mice, and their droppings. ? Remove any mold from your home. ? Keep your house clean and dust free. Use unscented cleaning products. ? Replace carpet with wood, tile, or vinyl flooring. Carpet can trap dander and dust. ? Use allergy-proof pillows, mattress covers, and box spring covers. ? Wash bed sheets and blankets every week in hot water. Dry them in a dryer. ? Use blankets that are made of polyester or cotton. ? Wash your hands often. ? Do not allow pets in your bedroom.  Avoid breathing in cold air when you exercise. General instructions  Have a plan for seeking medical care. Know when to call your health care provider and local emergency services, and where to get emergency care.  Stay up to date on your immunizations.  When you have an episode of bronchospasm, stay calm. Try to relax and breathe more slowly.  If you have asthma, make sure you have an asthma action plan.  Keep all follow-up visits as told by your health care provider. This is important. Contact a health care provider if:  You have muscle aches.  You have  chest pain.  The mucus that you cough up (sputum) changes from clear or white to yellow, green, gray, or bloody.  You have a fever.  Your sputum gets thicker. Get help right away if:  Your wheezing and coughing get worse, even after you take your prescribed medicines.  It gets even harder to breathe.  You develop severe chest pain. Summary  Bronchospasm is a tightening of the airways going into the  lungs.  During an episode of bronchospasm, you may have a harder time breathing. You may cough and make a whistling sound when you breathe (wheeze).  Avoid exposure to triggers such as smoke, dust, mold, animal dander, and fragrances.  When you have an episode of bronchospasm, stay calm. Try to relax and breathe more slowly. This information is not intended to replace advice given to you by your health care provider. Make sure you discuss any questions you have with your health care provider. Document Released: 03/22/2003 Document Revised: 03/15/2016 Document Reviewed: 03/15/2016 Elsevier Interactive Patient Education  2017 [employer].

## 2016-10-17 ENCOUNTER — Ambulatory Visit: Payer: BLUE CROSS/BLUE SHIELD | Admitting: Medical

## 2016-10-17 ENCOUNTER — Encounter: Payer: Self-pay | Admitting: Medical

## 2016-10-17 VITALS — BP 142/82 | HR 89 | Temp 98.2°F | Wt 103.2 lb

## 2016-10-17 DIAGNOSIS — Z87898 Personal history of other specified conditions: Secondary | ICD-10-CM

## 2016-10-17 NOTE — Progress Notes (Signed)
   Subjective:    Patient ID: Samantha Santana, female    DOB: July 16, 1950, 66 y.o.   MRN: [id number]  HPI 66 yo female returns for follow up of wheezing. Feeling better like she can take a deep breath. Stopped using ProAir after 3 days per my instruction. Using Advair as prescribed.Patient states it is an intense time with parents and getting their paperwork together for medicare.Taking Prozac which helps prescribed by her OB/GYN. She also says she is meeting next week with her financial advisor which most likely alleviate some of her stress. She is concerned about her retirement plans.   Review of Systems  Constitutional: Negative for chills and fever.  HENT: Negative for congestion, ear pain and sore throat.   Eyes: Negative for discharge and itching.  Respiratory: Negative for cough, shortness of breath and wheezing.   Cardiovascular: Negative for chest pain.  Gastrointestinal: Negative for abdominal pain.  Genitourinary: Negative for dysuria and hematuria.  Musculoskeletal: Negative for myalgias.  Neurological: Negative for dizziness and syncope.  Psychiatric/Behavioral: Negative for behavioral problems, confusion and hallucinations.       Objective:   Physical Exam  Constitutional: She is oriented to person, place, and time. She appears well-developed and well-nourished.  HENT:  Head: Normocephalic and atraumatic.  Right Ear: External ear normal.  Left Ear: External ear normal.  Eyes: Pupils are equal, round, and reactive to light. Conjunctivae and EOM are normal.  Cardiovascular: Normal rate, regular rhythm and normal heart sounds.  Exam reveals no gallop and no friction rub.   No murmur heard. Pulmonary/Chest: Effort normal and breath sounds normal. No respiratory distress. She has no wheezes.  Neurological: She is alert and oriented to person, place, and time.  Skin: Skin is warm and dry.  Psychiatric: She has a normal mood and affect. Her behavior is normal. Judgment and  thought content normal.  Nursing note and vitals reviewed.         Assessment & Plan:  Wheezing resolved.  Return to clinic as needed.   Reviewed with patient  Work life balance and counsling if needed.  She says she utilized Water quality scientist at [employer] which helps her and the situation with her parents.

## 2017-01-08 ENCOUNTER — Other Ambulatory Visit: Payer: BLUE CROSS/BLUE SHIELD

## 2017-01-08 DIAGNOSIS — Z0000 Encounter for general adult medical examination without abnormal findings: Secondary | ICD-10-CM

## 2017-01-09 LAB — COMPREHENSIVE METABOLIC PANEL
ALBUMIN: 4.5 g/dL (ref 3.6–4.8)
ALK PHOS: 64 IU/L (ref 39–117)
ALT: 19 IU/L (ref 0–32)
AST: 27 IU/L (ref 0–40)
Albumin/Globulin Ratio: 2.3 — ABNORMAL HIGH (ref 1.2–2.2)
BILIRUBIN TOTAL: 0.3 mg/dL (ref 0.0–1.2)
BUN/Creatinine Ratio: 12 (ref 12–28)
BUN: 8 mg/dL (ref 8–27)
CHLORIDE: 96 mmol/L (ref 96–106)
CO2: 27 mmol/L (ref 20–29)
Calcium: 8.8 mg/dL (ref 8.7–10.3)
Creatinine, Ser: 0.67 mg/dL (ref 0.57–1.00)
GFR calc Af Amer: 107 mL/min/{1.73_m2} (ref >59)
GFR calc non Af Amer: 93 mL/min/{1.73_m2} (ref >59)
GLOBULIN, TOTAL: 2 g/dL (ref 1.5–4.5)
GLUCOSE: 97 mg/dL (ref 65–99)
Potassium: 4.8 mmol/L (ref 3.5–5.2)
SODIUM: 134 mmol/L (ref 134–144)
Total Protein: 6.5 g/dL (ref 6.0–8.5)

## 2017-01-09 LAB — URINALYSIS, ROUTINE W REFLEX MICROSCOPIC
Bilirubin, UA: NEGATIVE
Glucose, UA: NEGATIVE
Ketones, UA: NEGATIVE
Leukocytes, UA: NEGATIVE
Nitrite, UA: NEGATIVE
PH UA: 8.5 — AB (ref 5.0–7.5)
PROTEIN UA: NEGATIVE
RBC, UA: NEGATIVE
Specific Gravity, UA: 1.006 (ref 1.005–1.030)
Urobilinogen, Ur: 0.2 mg/dL (ref 0.2–1.0)

## 2017-01-09 LAB — CBC WITH DIFFERENTIAL/PLATELET
BASOS ABS: 0 10*3/uL (ref 0.0–0.2)
Basos: 0 %
EOS (ABSOLUTE): 0.2 10*3/uL (ref 0.0–0.4)
Eos: 4 %
Hematocrit: 44.1 % (ref 34.0–46.6)
Hemoglobin: 14.2 g/dL (ref 11.1–15.9)
Immature Grans (Abs): 0 10*3/uL (ref 0.0–0.1)
Immature Granulocytes: 0 %
LYMPHS ABS: 2.3 10*3/uL (ref 0.7–3.1)
Lymphs: 41 %
MCH: 27.9 pg (ref 26.6–33.0)
MCHC: 32.2 g/dL (ref 31.5–35.7)
MCV: 87 fL (ref 79–97)
MONOS ABS: 0.4 10*3/uL (ref 0.1–0.9)
Monocytes: 7 %
Neutrophils Absolute: 2.6 10*3/uL (ref 1.4–7.0)
Neutrophils: 48 %
Platelets: 354 10*3/uL (ref 150–379)
RBC: 5.09 x10E6/uL (ref 3.77–5.28)
RDW: 14.6 % (ref 12.3–15.4)
WBC: 5.5 10*3/uL (ref 3.4–10.8)

## 2017-01-09 LAB — MAGNESIUM: MAGNESIUM: 2.2 mg/dL (ref 1.6–2.3)

## 2017-01-09 LAB — VITAMIN D 25 HYDROXY (VIT D DEFICIENCY, FRACTURES): Vit D, 25-Hydroxy: 66.6 ng/mL (ref 30.0–100.0)

## 2017-01-09 LAB — LIPID PANEL
CHOLESTEROL TOTAL: 137 mg/dL (ref 100–199)
Chol/HDL Ratio: 1.9 ratio (ref 0.0–4.4)
HDL: 74 mg/dL (ref >39)
LDL Calculated: 49 mg/dL (ref 0–99)
Triglycerides: 72 mg/dL (ref 0–149)
VLDL CHOLESTEROL CAL: 14 mg/dL (ref 5–40)

## 2017-01-09 LAB — TSH: TSH: 1.41 u[IU]/mL (ref 0.450–4.500)

## 2017-01-09 LAB — SPECIMEN STATUS REPORT

## 2017-02-04 ENCOUNTER — Telehealth: Payer: Self-pay

## 2017-02-04 NOTE — Telephone Encounter (Signed)
Copied from [place] [id number]. Topic: Inquiry >> Feb 04, 2017 10:43 AM Oliver Pila B wrote: Reason for CRM: Mrs. Utley called immediately after Dr. Silvio Pate called her. She said that he left her a message to let her know that he will try her at another number but the best number of contact is [phone]

## 2017-02-04 NOTE — Telephone Encounter (Signed)
PC with her to give condolences on her dad's recent death. Also, mom is now in hospital with hemorrhagic stroke. Discussed her care and expectation of need for SNF after hospital stay

## 2017-09-28 IMAGING — CR DG CLAVICLE*R*
1 series · 3 of 3 positions shown · non-contrast
Comparison: None.

CLINICAL DATA: Right side pain and swelling after yoga injury.

EXAM:
RIGHT CLAVICLE - 2+ VIEWS

[Series 1: w clavicle tangential right · 0.14mm/px · 3 of 3 slices shown]
[im 1/3]
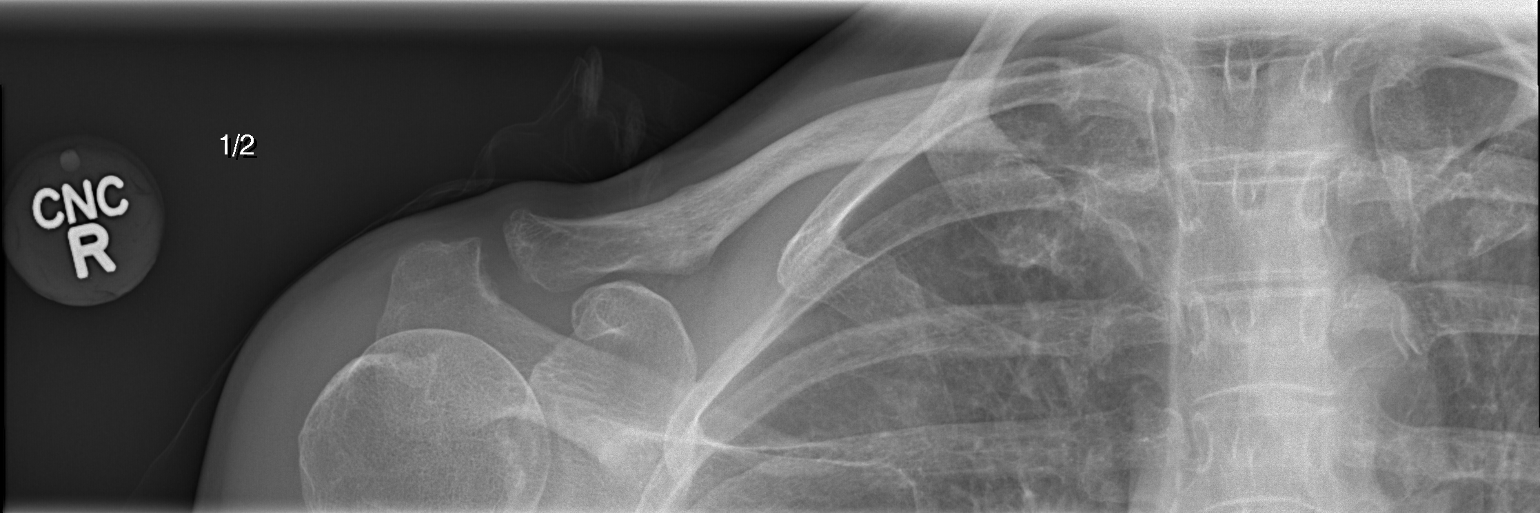
[im 2/3]
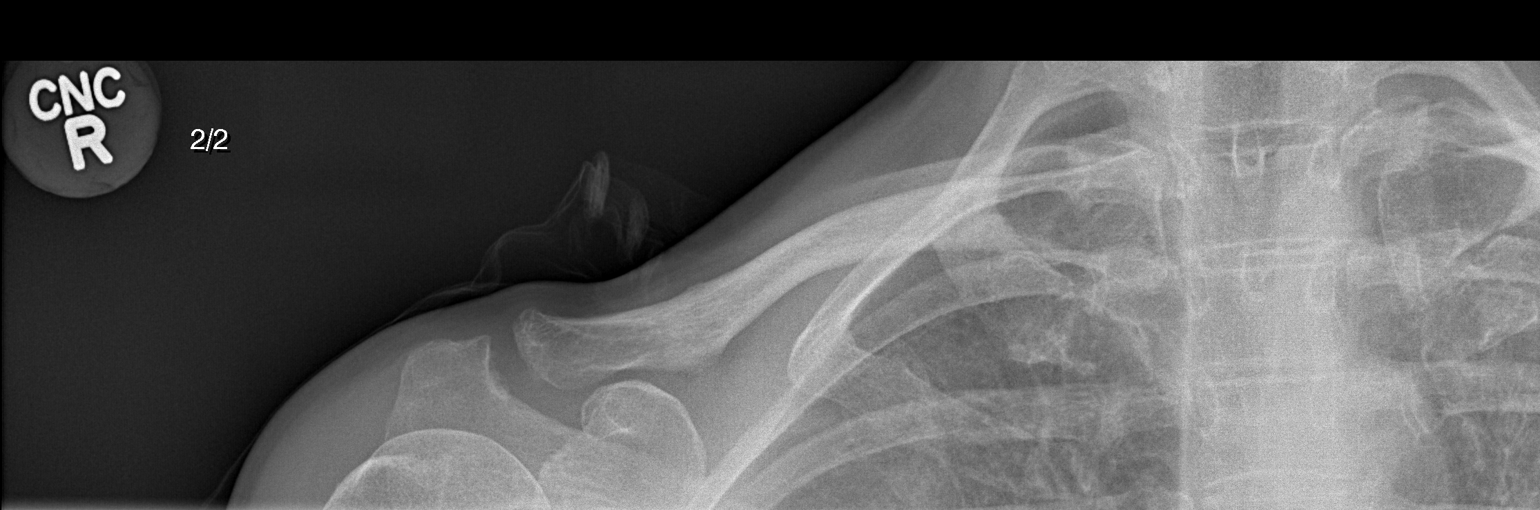
[im 3/3]
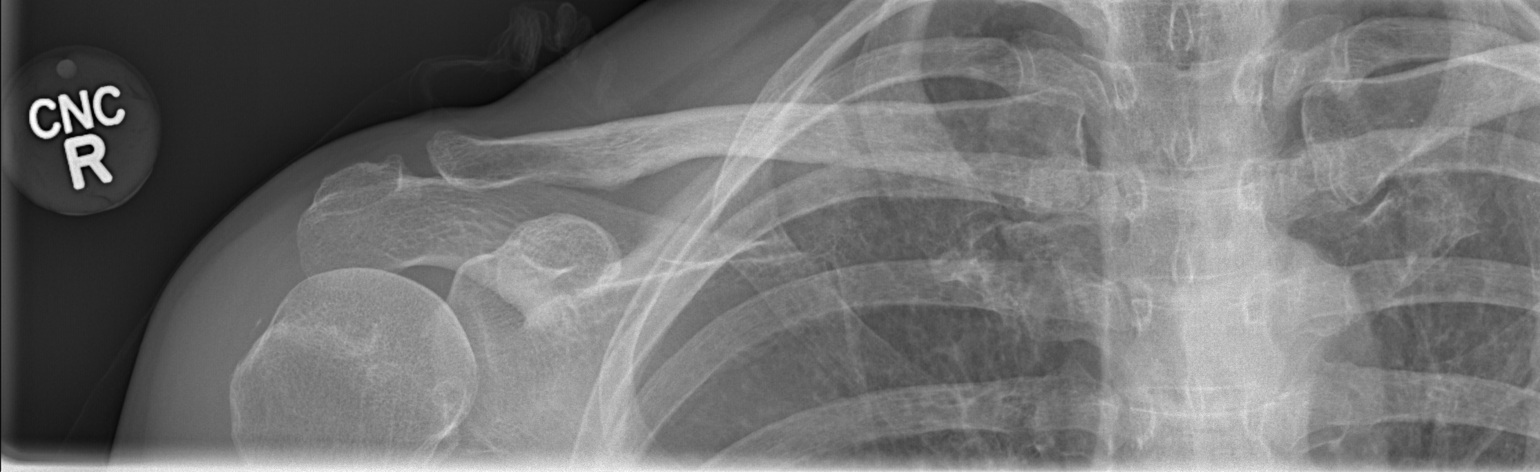

[3 of 3 positions shown; findings below may reference images not displayed]

FINDINGS: There is no evidence of fracture or other focal bone lesions. Soft
tissues are unremarkable.
IMPRESSION: Normal right clavicle.

## 2018-01-22 ENCOUNTER — Other Ambulatory Visit: Payer: Self-pay

## 2018-01-22 DIAGNOSIS — Z0000 Encounter for general adult medical examination without abnormal findings: Secondary | ICD-10-CM

## 2018-01-23 ENCOUNTER — Other Ambulatory Visit: Payer: BLUE CROSS/BLUE SHIELD

## 2018-01-23 DIAGNOSIS — Z0000 Encounter for general adult medical examination without abnormal findings: Secondary | ICD-10-CM

## 2018-01-24 LAB — URINALYSIS, ROUTINE W REFLEX MICROSCOPIC
Bilirubin, UA: NEGATIVE
Glucose, UA: NEGATIVE
Leukocytes, UA: NEGATIVE
NITRITE UA: NEGATIVE
PH UA: 7 (ref 5.0–7.5)
RBC, UA: NEGATIVE
Specific Gravity, UA: 1.01 (ref 1.005–1.030)
UUROB: 0.2 mg/dL (ref 0.2–1.0)

## 2018-01-24 LAB — COMPREHENSIVE METABOLIC PANEL
ALBUMIN: 4.5 g/dL (ref 3.6–4.8)
ALK PHOS: 82 IU/L (ref 39–117)
ALT: 15 IU/L (ref 0–32)
AST: 22 IU/L (ref 0–40)
Albumin/Globulin Ratio: 1.8 (ref 1.2–2.2)
BUN / CREAT RATIO: 10 — AB (ref 12–28)
BUN: 7 mg/dL — AB (ref 8–27)
Bilirubin Total: 0.3 mg/dL (ref 0.0–1.2)
CO2: 24 mmol/L (ref 20–29)
Calcium: 9.3 mg/dL (ref 8.7–10.3)
Chloride: 90 mmol/L — ABNORMAL LOW (ref 96–106)
Creatinine, Ser: 0.71 mg/dL (ref 0.57–1.00)
GFR calc Af Amer: 103 mL/min/{1.73_m2} (ref >59)
GFR calc non Af Amer: 90 mL/min/{1.73_m2} (ref >59)
GLUCOSE: 103 mg/dL — AB (ref 65–99)
Globulin, Total: 2.5 g/dL (ref 1.5–4.5)
Potassium: 4.5 mmol/L (ref 3.5–5.2)
Sodium: 131 mmol/L — ABNORMAL LOW (ref 134–144)
Total Protein: 7 g/dL (ref 6.0–8.5)

## 2018-01-24 LAB — LIPID PANEL
CHOL/HDL RATIO: 1.4 ratio (ref 0.0–4.4)
CHOLESTEROL TOTAL: 133 mg/dL (ref 100–199)
HDL: 95 mg/dL (ref >39)
LDL CALC: 28 mg/dL (ref 0–99)
Triglycerides: 49 mg/dL (ref 0–149)
VLDL CHOLESTEROL CAL: 10 mg/dL (ref 5–40)

## 2018-01-24 LAB — CBC WITH DIFFERENTIAL/PLATELET
BASOS ABS: 0 10*3/uL (ref 0.0–0.2)
Basos: 0 %
EOS (ABSOLUTE): 0.4 10*3/uL (ref 0.0–0.4)
Eos: 3 %
HEMOGLOBIN: 14.4 g/dL (ref 11.1–15.9)
Hematocrit: 43.6 % (ref 34.0–46.6)
IMMATURE GRANS (ABS): 0 10*3/uL (ref 0.0–0.1)
Immature Granulocytes: 0 %
LYMPHS: 19 %
Lymphocytes Absolute: 2.1 10*3/uL (ref 0.7–3.1)
MCH: 28.3 pg (ref 26.6–33.0)
MCHC: 33 g/dL (ref 31.5–35.7)
MCV: 86 fL (ref 79–97)
MONOCYTES: 11 %
Monocytes Absolute: 1.3 10*3/uL — ABNORMAL HIGH (ref 0.1–0.9)
NEUTROS PCT: 67 %
Neutrophils Absolute: 7.4 10*3/uL — ABNORMAL HIGH (ref 1.4–7.0)
PLATELETS: 387 10*3/uL (ref 150–450)
RBC: 5.09 x10E6/uL (ref 3.77–5.28)
RDW: 13.2 % (ref 12.3–15.4)
WBC: 11.2 10*3/uL — ABNORMAL HIGH (ref 3.4–10.8)

## 2018-01-24 LAB — VITAMIN D 25 HYDROXY (VIT D DEFICIENCY, FRACTURES): VIT D 25 HYDROXY: 74.2 ng/mL (ref 30.0–100.0)
